# Patient Record
Sex: Female | Born: 1990 | Race: White | Hispanic: No | Marital: Single | Smoking: Current every day smoker
Health system: Southern US, Community
[De-identification: ages and names within clinical notes are randomized; demographics above are authoritative.]

## PROBLEM LIST (undated history)

## (undated) DIAGNOSIS — F32A Depression, unspecified: Secondary | ICD-10-CM

## (undated) DIAGNOSIS — F329 Major depressive disorder, single episode, unspecified: Secondary | ICD-10-CM

## (undated) HISTORY — PX: ANKLE SURGERY: SHX546

## (undated) HISTORY — PX: FRACTURE SURGERY: SHX138

---

## 2010-06-16 ENCOUNTER — Emergency Department (HOSPITAL_BASED_OUTPATIENT_CLINIC_OR_DEPARTMENT_OTHER)
Admission: EM | Admit: 2010-06-16 | Discharge: 2010-06-16 | Disposition: A | Discharge status: Home / Self Care | Payer: Self-pay | Attending: Emergency Medicine | Admitting: Emergency Medicine

## 2010-06-16 DIAGNOSIS — H9209 Otalgia, unspecified ear: Secondary | ICD-10-CM | POA: Insufficient documentation

## 2010-06-16 DIAGNOSIS — H6690 Otitis media, unspecified, unspecified ear: Secondary | ICD-10-CM | POA: Insufficient documentation

## 2010-06-16 DIAGNOSIS — F17200 Nicotine dependence, unspecified, uncomplicated: Secondary | ICD-10-CM | POA: Insufficient documentation

## 2012-08-17 ENCOUNTER — Encounter (HOSPITAL_BASED_OUTPATIENT_CLINIC_OR_DEPARTMENT_OTHER): Payer: Self-pay | Admitting: *Deleted

## 2012-08-17 ENCOUNTER — Emergency Department (HOSPITAL_BASED_OUTPATIENT_CLINIC_OR_DEPARTMENT_OTHER): Payer: Self-pay

## 2012-08-17 ENCOUNTER — Emergency Department (HOSPITAL_BASED_OUTPATIENT_CLINIC_OR_DEPARTMENT_OTHER)
Admission: EM | Admit: 2012-08-17 | Discharge: 2012-08-17 | Disposition: A | Discharge status: Home / Self Care | Payer: Self-pay | Attending: Emergency Medicine | Admitting: Emergency Medicine

## 2012-08-17 DIAGNOSIS — S92425A Nondisplaced fracture of distal phalanx of left great toe, initial encounter for closed fracture: Secondary | ICD-10-CM

## 2012-08-17 DIAGNOSIS — W2209XA Striking against other stationary object, initial encounter: Secondary | ICD-10-CM | POA: Insufficient documentation

## 2012-08-17 DIAGNOSIS — Y929 Unspecified place or not applicable: Secondary | ICD-10-CM | POA: Insufficient documentation

## 2012-08-17 DIAGNOSIS — Z9889 Other specified postprocedural states: Secondary | ICD-10-CM | POA: Insufficient documentation

## 2012-08-17 DIAGNOSIS — F17200 Nicotine dependence, unspecified, uncomplicated: Secondary | ICD-10-CM | POA: Insufficient documentation

## 2012-08-17 DIAGNOSIS — Y9389 Activity, other specified: Secondary | ICD-10-CM | POA: Insufficient documentation

## 2012-08-17 DIAGNOSIS — S92919A Unspecified fracture of unspecified toe(s), initial encounter for closed fracture: Secondary | ICD-10-CM | POA: Insufficient documentation

## 2012-08-17 MED ORDER — IBUPROFEN 800 MG PO TABS: 800.0000 mg | ORAL_TABLET | Freq: Three times a day (TID) | ORAL | Status: DC | PRN

## 2012-08-17 MED ORDER — HYDROCODONE-ACETAMINOPHEN 5-325 MG PO TABS: 1.0000 | ORAL_TABLET | Freq: Four times a day (QID) | ORAL | Status: AC | PRN

## 2012-08-17 NOTE — Discharge Instructions (Signed)
Return here as needed.  Followup with the orthopedic Dr. Gustavus Bryant and elevate the toe

## 2012-08-17 NOTE — ED Provider Notes (Signed)
History     CSN: [id number]  Arrival date & time 08/17/12  1843   First MD Initiated Contact with Patient 08/17/12 1851      Chief Complaint  Patient presents with  . Toe Injury    (Consider location/radiation/quality/duration/timing/severity/associated sxs/prior treatment) HPI Patient presents to the emergency department following a toe injury that occurred last night.  Patient, states, that she kicked a cooler with her left great toe by accident.  Patient, states she did not take any medications prior to arrival for her pain.  Patient, states, that movement and palpation make the pain, worse.  Patient denies numbness, or weakness of the toe History reviewed. No pertinent past medical history.  Past Surgical History  Procedure Laterality Date  . Fracture surgery      History reviewed. No pertinent family history.  History  Substance Use Topics  . Smoking status: Current Every Day Smoker  . Smokeless tobacco: Not on file  . Alcohol Use: Yes    OB History   Grav Para Term Preterm Abortions TAB SAB Ect Mult Living                  Review of Systems All other systems negative except as documented in the HPI. All pertinent positives and negatives as reviewed in the HPI. Allergies  Review of patient's allergies indicates no known allergies.  Home Medications  No current outpatient prescriptions on file.  BP 115/68  Pulse 98  Temp(Src) 98.4 F (36.9 C) (Oral)  Resp 18  Ht 5\' 6"  (1.676 m)  Wt 125 lb (56.7 kg)  BMI 20.19 kg/m2  SpO2 99%  LMP 08/10/2012  Physical Exam  Nursing note and vitals reviewed. Constitutional: She is oriented to person, place, and time. She appears well-developed and well-nourished.  HENT:  Head: Normocephalic and atraumatic.  Pulmonary/Chest: Effort normal.  Musculoskeletal:       Left foot: She exhibits decreased range of motion, tenderness and swelling. She exhibits normal capillary refill, no deformity and no laceration.   Feet:  Neurological: She is alert and oriented to person, place, and time.  Skin: Skin is warm and dry.    ED Course  Procedures (including critical care time)  Labs Reviewed - No data to display Dg Toe Great Left  08/17/2012   *RADIOLOGY REPORT*  Clinical Data: Toe pain secondary to blunt trauma.  LEFT GREAT TOE  Comparison: None.  Findings: There is a minimally displaced fracture of the lateral aspect of the base of the distal phalangeal bone.  Fracture does extend to the articular surface.  IMPRESSION: Fracture of the base of the distal phalangeal bone.   Original Report Authenticated By: Francene Boyers, M.D.   Patient retreated for fracture at the DIP joint.  Patient is advised to return here as needed or any worsening in her condition.  Patient will be buddy taped with a hard sole shoe    MDM          Carlyle Dolly, PA-C 08/17/12 1954

## 2012-08-17 NOTE — ED Notes (Signed)
Pt states she stumped her left great toe last p.m.

## 2012-08-17 NOTE — ED Provider Notes (Signed)
Medical screening examination/treatment/procedure(s) were performed by non-physician practitioner and as supervising physician I was immediately available for consultation/collaboration.   Charles B. Sheldon, MD 08/17/12 2334 

## 2013-06-16 ENCOUNTER — Other Ambulatory Visit: Payer: Self-pay

## 2013-12-17 ENCOUNTER — Other Ambulatory Visit (HOSPITAL_COMMUNITY): Payer: Self-pay | Admitting: Specialist

## 2013-12-18 ENCOUNTER — Encounter (HOSPITAL_COMMUNITY): Payer: Self-pay

## 2013-12-18 ENCOUNTER — Ambulatory Visit (HOSPITAL_COMMUNITY)
Admission: RE | Admit: 2013-12-18 | Discharge: 2013-12-18 | Disposition: A | Discharge status: Home / Self Care | Payer: Medicaid Other | Source: Ambulatory Visit | Attending: Specialist | Admitting: Specialist

## 2013-12-18 DIAGNOSIS — O99810 Abnormal glucose complicating pregnancy: Secondary | ICD-10-CM | POA: Insufficient documentation

## 2013-12-18 DIAGNOSIS — O24419 Gestational diabetes mellitus in pregnancy, unspecified control: Secondary | ICD-10-CM

## 2013-12-18 DIAGNOSIS — O365990 Maternal care for other known or suspected poor fetal growth, unspecified trimester, not applicable or unspecified: Secondary | ICD-10-CM | POA: Diagnosis not present

## 2013-12-18 DIAGNOSIS — Z0374 Encounter for suspected problem with fetal growth ruled out: Secondary | ICD-10-CM | POA: Diagnosis not present

## 2014-02-01 ENCOUNTER — Encounter (HOSPITAL_COMMUNITY): Payer: Self-pay

## 2014-05-10 ENCOUNTER — Emergency Department (HOSPITAL_BASED_OUTPATIENT_CLINIC_OR_DEPARTMENT_OTHER): Payer: Medicaid Other

## 2014-05-10 ENCOUNTER — Ambulatory Visit: Payer: Self-pay

## 2014-05-10 ENCOUNTER — Emergency Department (HOSPITAL_BASED_OUTPATIENT_CLINIC_OR_DEPARTMENT_OTHER)
Admission: EM | Admit: 2014-05-10 | Discharge: 2014-05-10 | Disposition: A | Discharge status: Home / Self Care | Payer: Medicaid Other | Attending: Emergency Medicine | Admitting: Emergency Medicine

## 2014-05-10 ENCOUNTER — Encounter (HOSPITAL_BASED_OUTPATIENT_CLINIC_OR_DEPARTMENT_OTHER): Payer: Self-pay | Admitting: Emergency Medicine

## 2014-05-10 DIAGNOSIS — S4991XA Unspecified injury of right shoulder and upper arm, initial encounter: Secondary | ICD-10-CM | POA: Insufficient documentation

## 2014-05-10 DIAGNOSIS — Y9389 Activity, other specified: Secondary | ICD-10-CM | POA: Insufficient documentation

## 2014-05-10 DIAGNOSIS — G8929 Other chronic pain: Secondary | ICD-10-CM | POA: Insufficient documentation

## 2014-05-10 DIAGNOSIS — R6884 Jaw pain: Secondary | ICD-10-CM | POA: Insufficient documentation

## 2014-05-10 DIAGNOSIS — M25511 Pain in right shoulder: Secondary | ICD-10-CM | POA: Diagnosis present

## 2014-05-10 DIAGNOSIS — Y9289 Other specified places as the place of occurrence of the external cause: Secondary | ICD-10-CM | POA: Diagnosis not present

## 2014-05-10 DIAGNOSIS — Z79899 Other long term (current) drug therapy: Secondary | ICD-10-CM | POA: Insufficient documentation

## 2014-05-10 DIAGNOSIS — X58XXXA Exposure to other specified factors, initial encounter: Secondary | ICD-10-CM | POA: Diagnosis not present

## 2014-05-10 DIAGNOSIS — Z791 Long term (current) use of non-steroidal anti-inflammatories (NSAID): Secondary | ICD-10-CM | POA: Diagnosis not present

## 2014-05-10 DIAGNOSIS — Z720 Tobacco use: Secondary | ICD-10-CM | POA: Diagnosis not present

## 2014-05-10 DIAGNOSIS — M25519 Pain in unspecified shoulder: Secondary | ICD-10-CM

## 2014-05-10 DIAGNOSIS — Y998 Other external cause status: Secondary | ICD-10-CM | POA: Insufficient documentation

## 2014-05-10 DIAGNOSIS — M549 Dorsalgia, unspecified: Secondary | ICD-10-CM | POA: Insufficient documentation

## 2014-05-10 DIAGNOSIS — Z794 Long term (current) use of insulin: Secondary | ICD-10-CM | POA: Insufficient documentation

## 2014-05-10 MED ORDER — IBUPROFEN 400 MG PO TABS
600.0000 mg | ORAL_TABLET | Freq: Once | ORAL | Status: AC
  Administered 2014-05-10: 600 mg via ORAL
  Filled 2014-05-10 (×2): qty 1

## 2014-05-10 MED ORDER — IBUPROFEN 600 MG PO TABS: 600.0000 mg | ORAL_TABLET | Freq: Three times a day (TID) | ORAL | Status: DC | PRN

## 2014-05-10 MED ORDER — HYDROCODONE-ACETAMINOPHEN 5-325 MG PO TABS: 1.0000 | ORAL_TABLET | Freq: Four times a day (QID) | ORAL | Status: AC | PRN

## 2014-05-10 NOTE — ED Notes (Signed)
MD at bedside to discuss results of testing. 

## 2014-05-10 NOTE — ED Notes (Signed)
Pt reports pain to right shoulder x 4 weeks, sharp shooting pains, also reports left sided jaw pain for years, unable to open completely

## 2014-05-10 NOTE — Discharge Instructions (Signed)

## 2014-05-10 NOTE — ED Provider Notes (Signed)
CSN: [id number]     Arrival date & time 05/10/14  1908 History   This chart was scribed for Elwin Mocha, MD by Abel Presto, ED Scribe. This patient was seen in room MH04/MH04 and the patient's care was started at 7:31 PM.    Chief Complaint  Patient presents with  . Shoulder Pain    Patient is a 24 y.o. female presenting with shoulder pain. The history is provided by the patient. No language interpreter was used.  Shoulder Pain Location:  Shoulder Time since incident:  4 weeks Shoulder location:  R shoulder Pain details:    Quality:  Shooting and sharp   Radiates to:  Does not radiate   Severity:  Moderate   Onset quality:  Sudden   Timing:  Sporadic   Progression:  Worsening Chronicity:  Recurrent Handedness:  Right-handed Dislocation: no   Foreign body present:  No foreign bodies Prior injury to area:  No Relieved by:  Being still Worsened by:  Movement Associated symptoms: back pain (chronic) and decreased range of motion   Associated symptoms: no numbness and no tingling     HPI Comments: Juliani Laduke is a 24 y.o. female who presents to the Emergency Department complaining of recurrent sharp shooting right shoulder pain with first onset 2 weeks ago worsening several days ago.  Pt states she was wiping a table at first onset and heard a popping noise. She notes pain resolved and returned.  Pt notes pain with movement.  She states she also has back pain but notes this is not new. Pt notes jaw pain. She states she is unable to open jaw completely. She states she gets associated headaches and notes yawns worsen the pain. Pt notes jaw pain started after removal of braces. Pt denies any injury before recent onset, numbness, and tingling.   History reviewed. No pertinent past medical history. Past Surgical History  Procedure Laterality Date  . Fracture surgery     History reviewed. No pertinent family history. History  Substance Use Topics  . Smoking status: Current Every  Day Smoker  . Smokeless tobacco: Not on file  . Alcohol Use: Yes   OB History    Gravida Para Term Preterm AB TAB SAB Ectopic Multiple Living       Review of Systems  HENT:       Jaw pain  Musculoskeletal: Positive for back pain (chronic) and arthralgias.  Neurological: Negative for numbness.  All other systems reviewed and are negative.     Allergies  Review of patient's allergies indicates no known allergies.  Home Medications   Prior to Admission medications   Medication Sig Start Date End Date Taking? Authorizing Provider  ibuprofen (ADVIL,MOTRIN) 800 MG tablet Take 1 tablet (800 mg total) by mouth every 8 (eight) hours as needed for pain. 08/17/12  Yes Jamesetta Orleans Lawyer, PA-C  HYDROcodone-acetaminophen (NORCO/VICODIN) 5-325 MG per tablet Take 1 tablet by mouth every 6 (six) hours as needed for pain. 08/17/12   Jamesetta Orleans Lawyer, PA-C  insulin NPH Human (HUMULIN N,NOVOLIN N) 100 UNIT/ML injection Inject 24 Units into the skin at bedtime.    Historical Provider, MD  Prenatal Vit w/Fe-Methylfol-FA (PNV PO) Take by mouth.    Historical Provider, MD   BP 138/74 mmHg  Pulse 86  Temp(Src) 98.7 F (37.1 C) (Oral)  Resp 18  Ht  (1.676 m)  Wt 133 lb (60.328 kg)  BMI 21.48 kg/m2  SpO2 100%  LMP 04/21/2014  Breastfeeding? Unknown Physical Exam  Constitutional: She is oriented to person, place, and time. She appears well-developed and well-nourished. No distress.  HENT:  Head: Normocephalic and atraumatic.  Eyes: EOM are normal.  Neck: Normal range of motion.  Cardiovascular: Normal rate, regular rhythm and normal heart sounds.   Pulmonary/Chest: Effort normal and breath sounds normal.  Abdominal: Soft. She exhibits no distension. There is no tenderness.  Musculoskeletal: Normal range of motion.  Neurological: She is alert and oriented to person, place, and time.  Skin: Skin is warm and dry.  Psychiatric: She has a normal mood and affect.  Judgment normal.  Nursing note and vitals reviewed.   ED Course  Procedures (including critical care time) DIAGNOSTIC STUDIES: Oxygen Saturation is 100% on room air, normal by my interpretation.    COORDINATION OF CARE: 7:38 PM Discussed treatment plan with patient at beside, the patient agrees with the plan and has no further questions at this time.   Labs Review Labs Reviewed - No data to display  Imaging Review Dg Shoulder Right  05/10/2014   CLINICAL DATA:  RIGHT shoulder pain for 4 weeks.  No injury.  EXAM: RIGHT SHOULDER - 2+ VIEW  COMPARISON:  None.  FINDINGS: There is no evidence of fracture or dislocation. There is no evidence of arthropathy or other focal bone abnormality. Soft tissues are unremarkable.  IMPRESSION: Negative.   Electronically Signed   By: Andreas NewportGeoffrey  Lamke M.D.   On: 05/10/2014 20:31     EKG Interpretation None      MDM   Final diagnoses:  Shoulder pain  Jaw pain   85F here with R shoulder pain. Heard a pop when wiping a table down a few days ago. Now having difficulty raising shoulder past about 45 degrees and using her R arm in general.  Also having pain with opening of her jaw on the L side, feels it pop routinely. No history of jaw dislocations.  On exam, pain with int/ext rotation of R shoulder. It is clinically rotated. No swelling. Will xray, I feel it is likely rotator cuff in origin. Xray negative. Given NSAIDs and small amount of pain medicine. L TMJ joint with some laxity. No malocclusion, no signs of dislocation. Will have her f/u with ENT.   I personally performed the services described in this documentation, which was scribed in my presence. The recorded information has been reviewed and is accurate.      Elwin MochaBlair Aldeen Riga, MD 05/10/14 2141

## 2015-03-15 IMAGING — CR DG TOE GREAT 2+V*L*
3 series · 3 of 3 positions shown · non-contrast
Comparison: None.

CLINICAL DATA: Toe pain secondary to blunt trauma.

LEFT GREAT TOE

[t toes ap left]
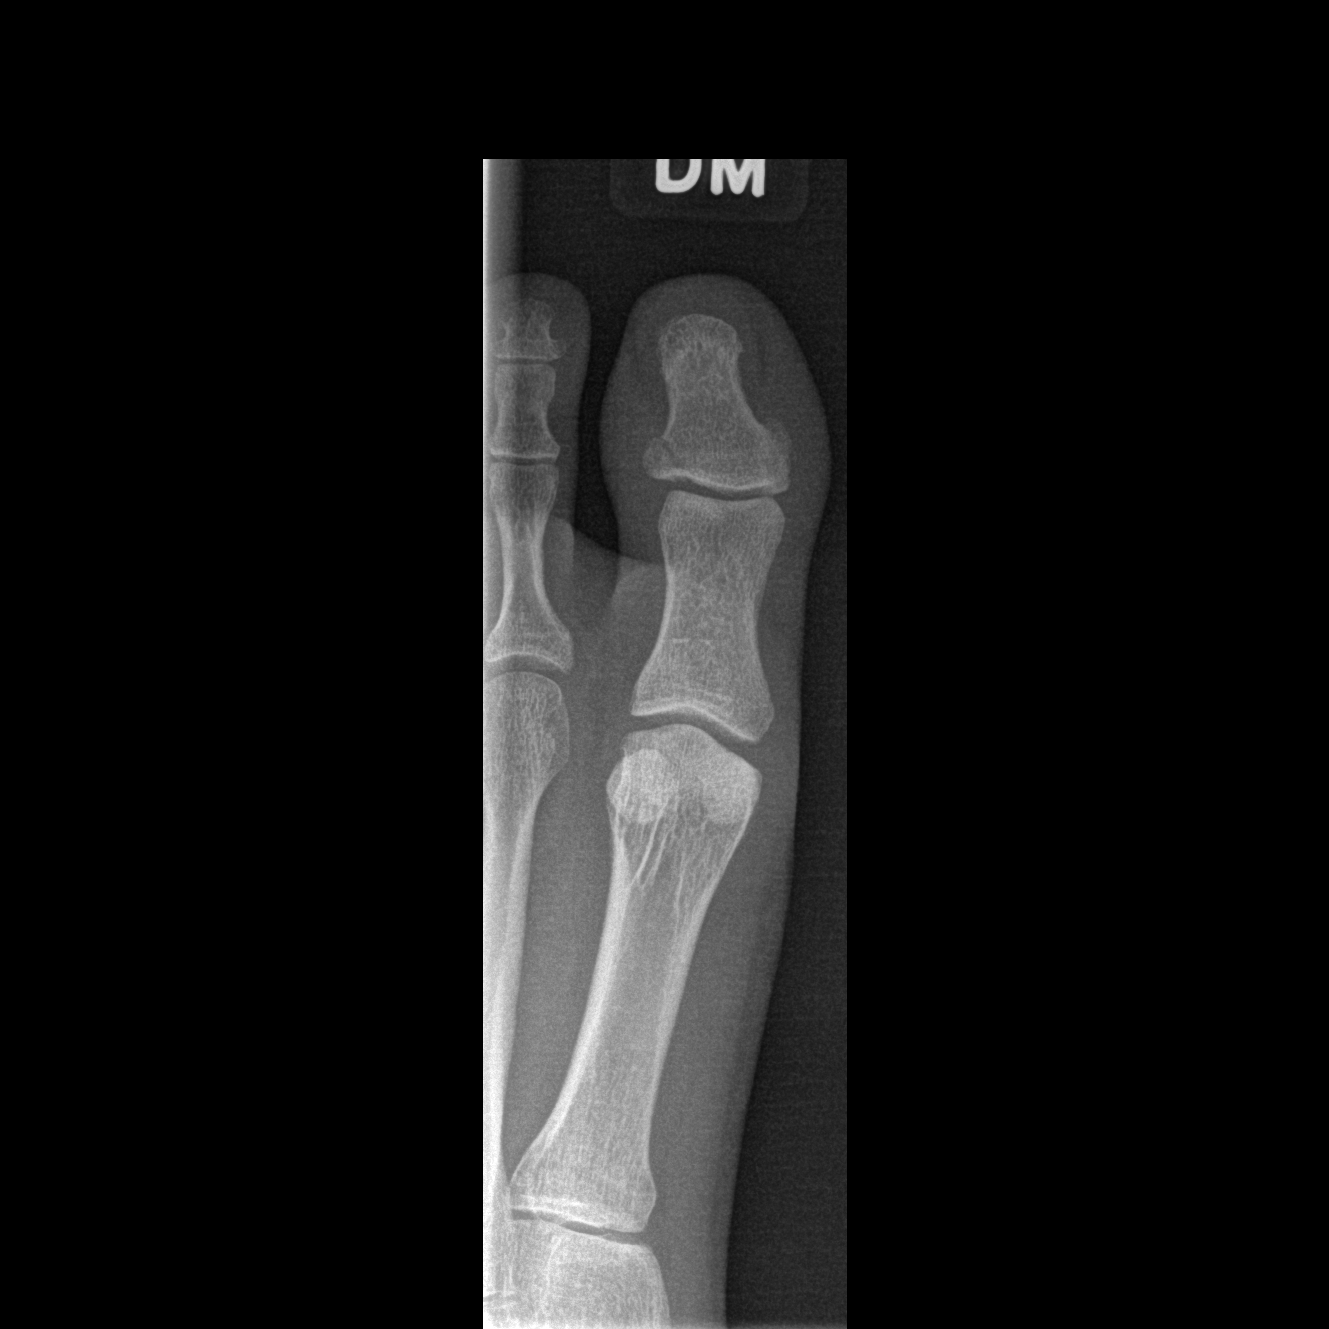

[t toes oblique left]
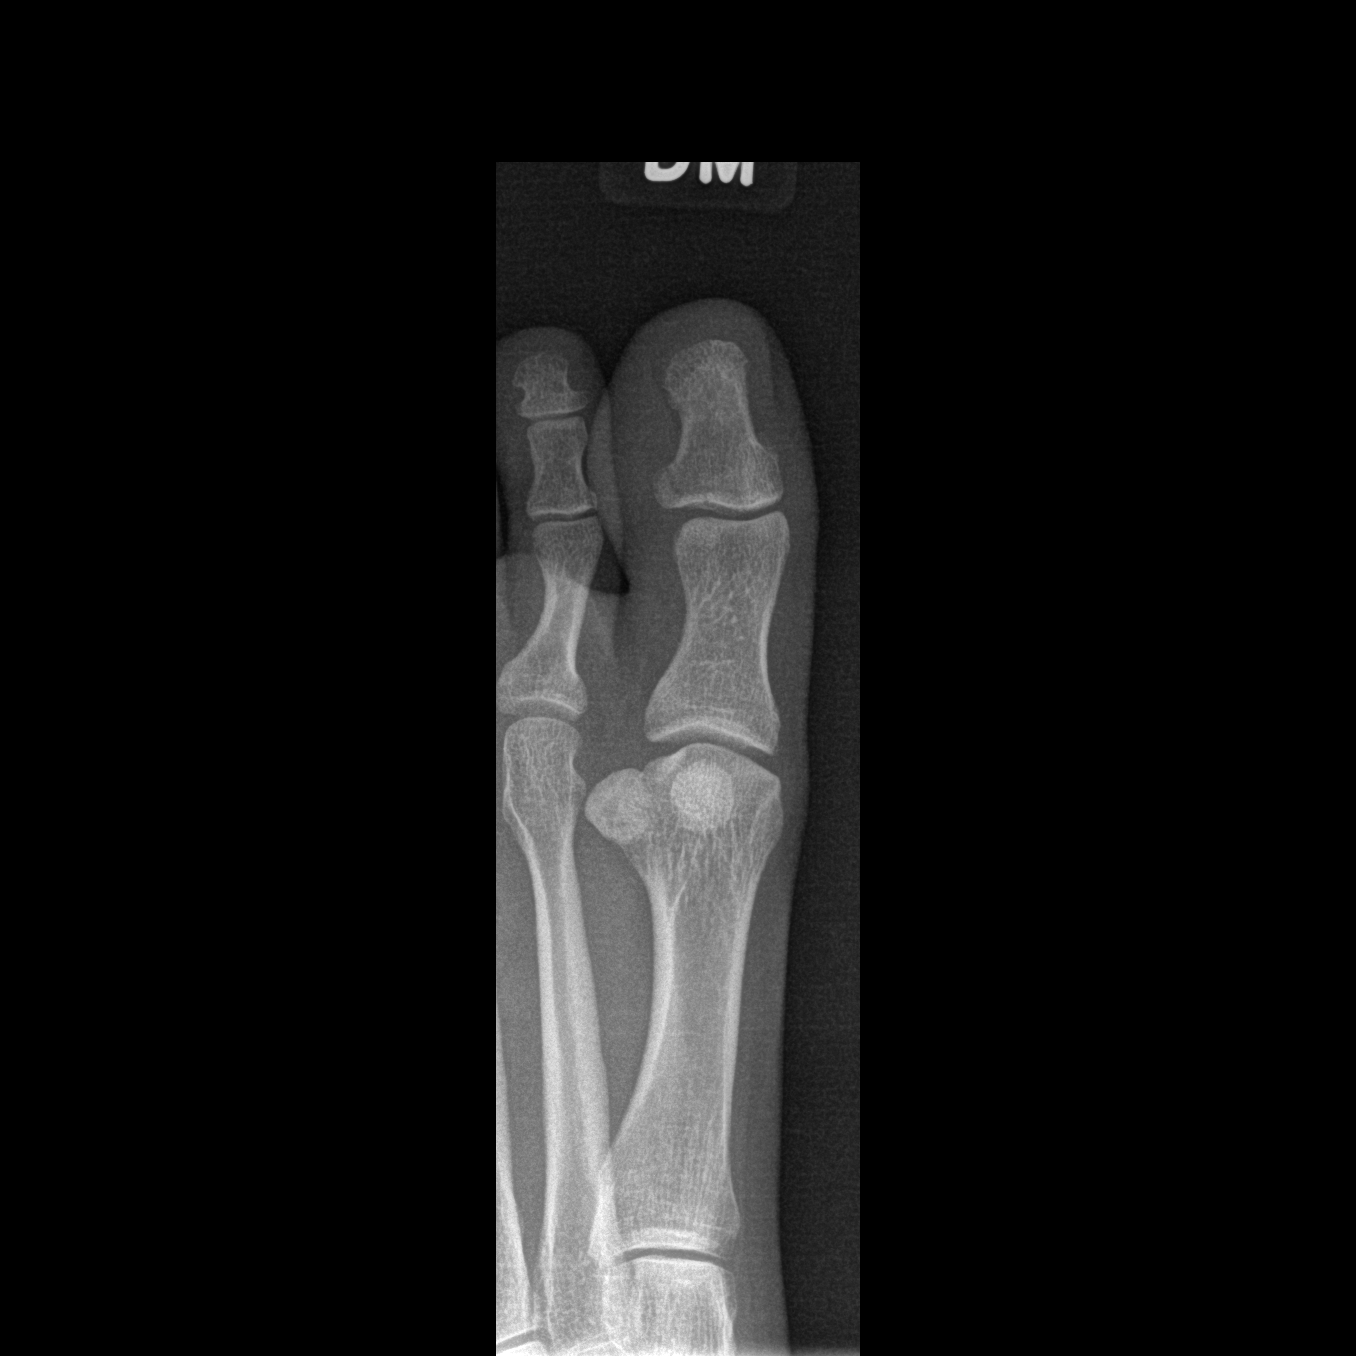

[t toes lateral left]
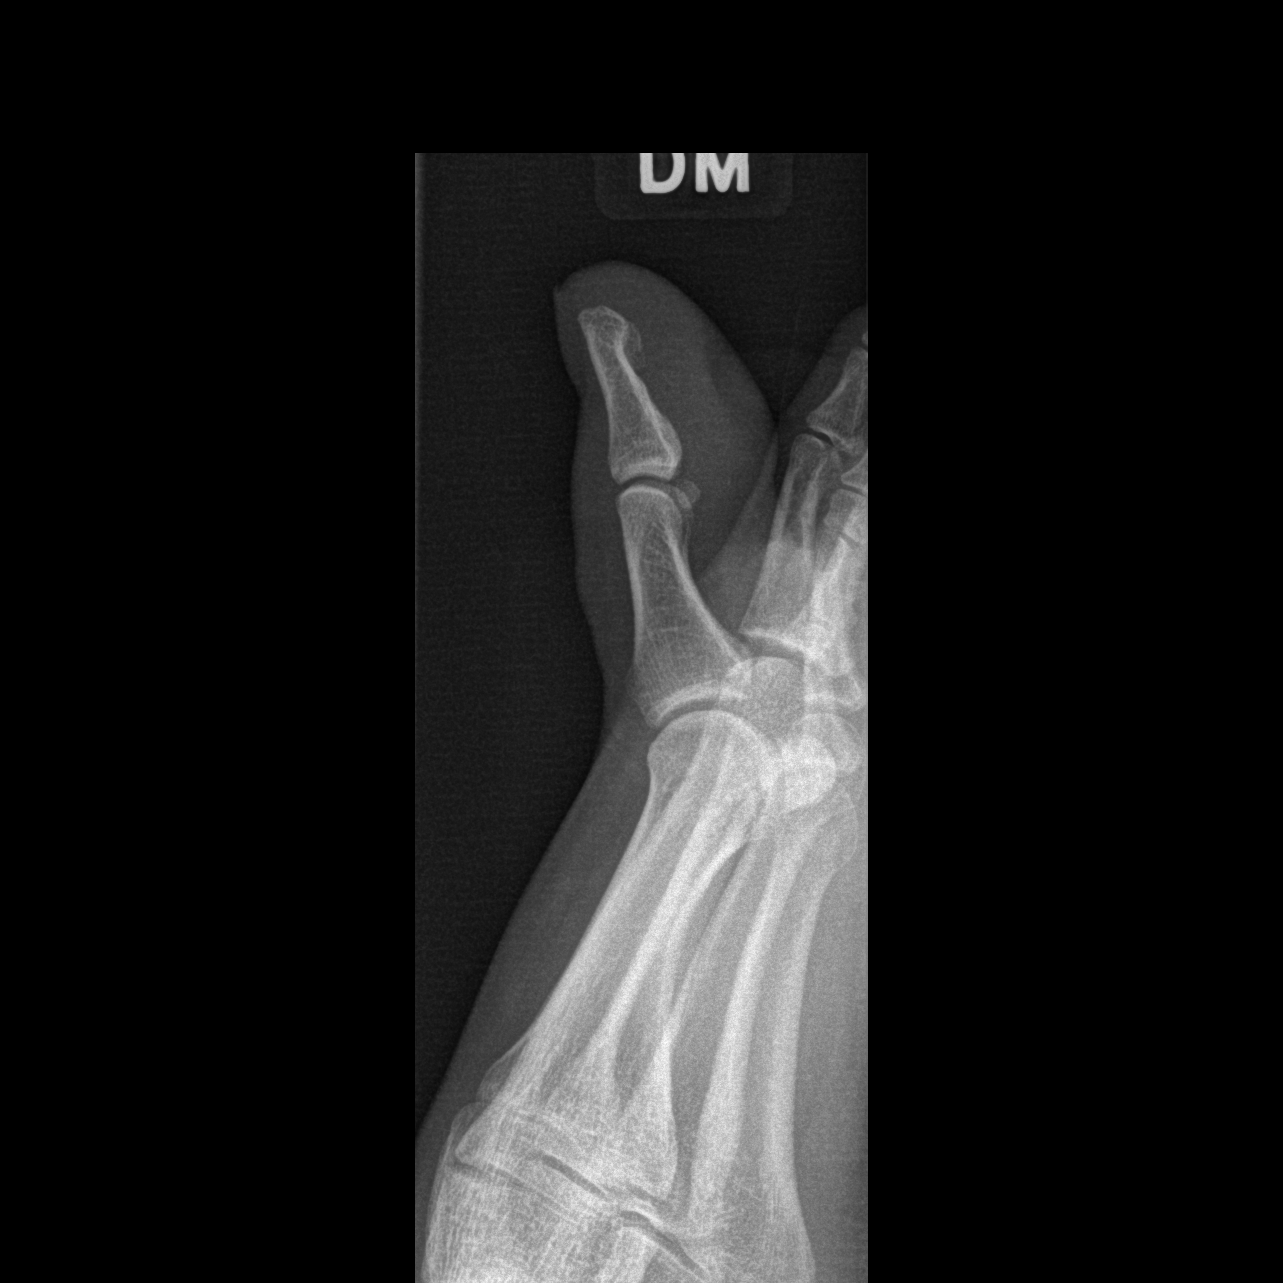

[3 of 3 positions shown; findings below may reference images not displayed]

FINDINGS: There is a minimally displaced fracture of the lateral
aspect of the base of the distal phalangeal bone.  Fracture does
extend to the articular surface.
IMPRESSION: Fracture of the base of the distal phalangeal bone.

## 2017-11-20 ENCOUNTER — Other Ambulatory Visit: Payer: Self-pay

## 2017-11-20 ENCOUNTER — Emergency Department (HOSPITAL_BASED_OUTPATIENT_CLINIC_OR_DEPARTMENT_OTHER)
Admission: EM | Admit: 2017-11-20 | Discharge: 2017-11-20 | Disposition: A | Discharge status: Home / Self Care | Payer: No Typology Code available for payment source | Attending: Emergency Medicine | Admitting: Emergency Medicine

## 2017-11-20 ENCOUNTER — Emergency Department (HOSPITAL_BASED_OUTPATIENT_CLINIC_OR_DEPARTMENT_OTHER): Payer: No Typology Code available for payment source

## 2017-11-20 ENCOUNTER — Encounter (HOSPITAL_BASED_OUTPATIENT_CLINIC_OR_DEPARTMENT_OTHER): Payer: Self-pay

## 2017-11-20 DIAGNOSIS — F17200 Nicotine dependence, unspecified, uncomplicated: Secondary | ICD-10-CM | POA: Insufficient documentation

## 2017-11-20 DIAGNOSIS — R51 Headache: Secondary | ICD-10-CM | POA: Diagnosis present

## 2017-11-20 DIAGNOSIS — M542 Cervicalgia: Secondary | ICD-10-CM | POA: Diagnosis not present

## 2017-11-20 HISTORY — DX: Depression, unspecified: F32.A

## 2017-11-20 HISTORY — DX: Major depressive disorder, single episode, unspecified: F32.9

## 2017-11-20 LAB — PREGNANCY, URINE: Preg Test, Ur: NEGATIVE

## 2017-11-20 MED ORDER — ACETAMINOPHEN 500 MG PO TABS
1000.0000 mg | ORAL_TABLET | Freq: Once | ORAL | Status: AC
Start: 2017-11-20 — End: 2017-11-20
  Administered 2017-11-20: 1000 mg via ORAL
  Filled 2017-11-20: qty 2
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
## 2017-11-22 ENCOUNTER — Other Ambulatory Visit: Payer: Self-pay

## 2017-11-22 ENCOUNTER — Emergency Department (HOSPITAL_BASED_OUTPATIENT_CLINIC_OR_DEPARTMENT_OTHER)
Admission: EM | Admit: 2017-11-22 | Discharge: 2017-11-22 | Disposition: A | Discharge status: Home / Self Care | Payer: No Typology Code available for payment source | Attending: Emergency Medicine | Admitting: Emergency Medicine

## 2017-11-22 ENCOUNTER — Encounter (HOSPITAL_BASED_OUTPATIENT_CLINIC_OR_DEPARTMENT_OTHER): Payer: Self-pay | Admitting: *Deleted

## 2017-11-22 DIAGNOSIS — M546 Pain in thoracic spine: Secondary | ICD-10-CM | POA: Diagnosis present

## 2017-11-22 DIAGNOSIS — Z794 Long term (current) use of insulin: Secondary | ICD-10-CM | POA: Insufficient documentation

## 2017-11-22 DIAGNOSIS — F17200 Nicotine dependence, unspecified, uncomplicated: Secondary | ICD-10-CM | POA: Insufficient documentation

## 2017-11-22 DIAGNOSIS — Z09 Encounter for follow-up examination after completed treatment for conditions other than malignant neoplasm: Secondary | ICD-10-CM

## 2017-11-22 DIAGNOSIS — R51 Headache: Secondary | ICD-10-CM | POA: Insufficient documentation

## 2017-11-22 DIAGNOSIS — R42 Dizziness and giddiness: Secondary | ICD-10-CM | POA: Insufficient documentation

## 2017-11-22 DIAGNOSIS — Z79899 Other long term (current) drug therapy: Secondary | ICD-10-CM | POA: Diagnosis not present

## 2017-11-22 MED ORDER — NAPROXEN 500 MG PO TABS: 500.0000 mg | ORAL_TABLET | Freq: Two times a day (BID) | ORAL | 0 refills | Status: AC

## 2017-11-22 MED ORDER — METHOCARBAMOL 500 MG PO TABS: 500.0000 mg | ORAL_TABLET | Freq: Two times a day (BID) | ORAL | 0 refills | Status: AC

## 2017-11-22 MED ORDER — LIDOCAINE 5 % EX PTCH: 1.0000 | MEDICATED_PATCH | CUTANEOUS | 0 refills | Status: AC

## 2017-11-22 NOTE — Discharge Instructions (Addendum)
°  Antiinflammatory medications: Take 600 mg of ibuprofen every 6 hours or 440 mg (over the counter dose) to 500 mg (prescription dose) of naproxen every 12 hours for the next 3 days. After this time, these medications may be used as needed for pain. Take these medications with food to avoid upset stomach. Choose only one of these medications, do not take them together. Acetaminophen (generic for Tylenol): Should you continue to have additional pain while taking the ibuprofen or naproxen, you may add in acetaminophen as needed. Your daily total maximum amount of acetaminophen from all sources should be limited to $RemoveBefor2000mg /day for those with liver problems. Muscle relaxer: Robaxin is a muscle relaxer and may help loosen stiff muscles. Do not take the Robaxin while driving or performing other dangerous activities.  Lidocaine patches: These are available via either prescription or over-the-counter. The over-the-counter option may be more economical one and are likely just as effective. There are multiple over-the-counter brands, such as Salonpas. Exercises: Be sure to perform the attached exercises starting with three times a week and working up to performing them daily. This is an essential part of preventing long term problems.  Follow up: Follow up with a primary care provider for any future management of these complaints. Be sure to follow up within 7-10 days. Return: Return to the ED should symptoms worsen.   Overall head injury/concussion care: Rest: Be sure to get plenty of rest. You will need more rest and sleep while you recover. Hydration: Be sure to stay well hydrated by having a goal of drinking about 0.5 liters of water an hour. Return to sports and activities: In general, you may return to normal activities once symptoms have subsided, however, you would ideally be cleared by a primary care provider or other qualified medical professional prior to  return to these activities.  Follow up: Follow up with the concussion clinic or your primary care provider for further management of this issue. Return: Return to the ED should you begin to have confusion, abnormal behavior, aggression, violence, or personality changes, repeated vomiting, vision loss, numbness or weakness on one side of the body, difficulty standing due to dizziness, significantly worsening pain, or any other major concerns.
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
## 2018-01-15 ENCOUNTER — Other Ambulatory Visit: Payer: Self-pay

## 2018-01-15 ENCOUNTER — Emergency Department (HOSPITAL_BASED_OUTPATIENT_CLINIC_OR_DEPARTMENT_OTHER)
Admission: EM | Admit: 2018-01-15 | Discharge: 2018-01-15 | Disposition: A | Discharge status: Home / Self Care | Payer: Self-pay | Attending: Emergency Medicine | Admitting: Emergency Medicine

## 2018-01-15 ENCOUNTER — Encounter (HOSPITAL_BASED_OUTPATIENT_CLINIC_OR_DEPARTMENT_OTHER): Payer: Self-pay | Admitting: *Deleted

## 2018-01-15 DIAGNOSIS — Z3201 Encounter for pregnancy test, result positive: Secondary | ICD-10-CM | POA: Insufficient documentation

## 2018-01-15 DIAGNOSIS — O26891 Other specified pregnancy related conditions, first trimester: Secondary | ICD-10-CM | POA: Insufficient documentation

## 2018-01-15 DIAGNOSIS — F17200 Nicotine dependence, unspecified, uncomplicated: Secondary | ICD-10-CM | POA: Insufficient documentation

## 2018-01-15 DIAGNOSIS — R1030 Lower abdominal pain, unspecified: Secondary | ICD-10-CM | POA: Insufficient documentation

## 2018-01-15 LAB — URINALYSIS, MICROSCOPIC (REFLEX)

## 2018-01-15 LAB — URINALYSIS, ROUTINE W REFLEX MICROSCOPIC
BILIRUBIN URINE: NEGATIVE
GLUCOSE, UA: NEGATIVE mg/dL
Hgb urine dipstick: NEGATIVE
KETONES UR: NEGATIVE mg/dL
Nitrite: NEGATIVE
PH: 7.5 (ref 5.0–8.0)
PROTEIN: NEGATIVE mg/dL
Specific Gravity, Urine: 1.015 (ref 1.005–1.030)

## 2018-01-15 LAB — HCG, QUANTITATIVE, PREGNANCY: hCG, Beta Chain, Quant, S: 7 m[IU]/mL — ABNORMAL HIGH (ref <5)

## 2018-01-15 LAB — PREGNANCY, URINE: PREG TEST UR: NEGATIVE

## 2018-01-15 NOTE — ED Notes (Signed)
NAD at this time. Pt is stable and going home.  

## 2018-01-15 NOTE — ED Triage Notes (Signed)
Pt reports one week of constipation, abd "fullness" had 3 + HPT last week, this am had increased cramping with some light spotting when she wiped, took another HPT and it read negative. Pt states she went to work, and her coworkers told her to come here and get looked at.

## 2018-01-15 NOTE — ED Provider Notes (Signed)
[facility] EMERGENCY DEPARTMENT Provider Note   CSN: [id number] Arrival date & time: 01/15/18  1027     History   Chief Complaint Chief Complaint  Patient presents with  . Abdominal Cramping    HPI Monique Gonzales is a 27 y.o. female.  HPI Patient reports she is taken for positive home pregnancy test that were positive and then she took another one today and it was negative.  She reports he has had all the symptoms and feels like she is pregnant.  She has had nausea in the mornings.  She is had crampy lower abdominal pain.  She reports she only spotted for a couple of days 2 weeks ago.  Ports she has a feeling like she is constipated,  Past Medical History:  Diagnosis Date  . Depression     There are no active problems to display for this patient.   Past Surgical History:  Procedure Laterality Date  . ANKLE SURGERY    . FRACTURE SURGERY       OB History    Gravida  2   Para  0   Term  0   Preterm  0   AB  1   Living  0     SAB  0   TAB  1   Ectopic  0   Multiple  0   Live Births               Home Medications    Prior to Admission medications   Medication Sig Start Date End Date Taking? Authorizing Provider  HYDROcodone-acetaminophen (NORCO/VICODIN) 5-325 MG per tablet Take 1 tablet by mouth every 6 (six) hours as needed for pain. 08/17/12   Lawyer, Cristal Deer, PA-C  HYDROcodone-acetaminophen (NORCO/VICODIN) 5-325 MG per tablet Take 1 tablet by mouth every 6 (six) hours as needed for moderate pain. 05/10/14   Elwin Mocha, MD  ibuprofen (ADVIL,MOTRIN) 600 MG tablet Take 1 tablet (600 mg total) by mouth every 8 (eight) hours as needed. 05/10/14   Elwin Mocha, MD  ibuprofen (ADVIL,MOTRIN) 800 MG tablet Take 1 tablet (800 mg total) by mouth every 8 (eight) hours as needed for pain. 08/17/12   Lawyer, Cristal Deer, PA-C  insulin NPH Human (HUMULIN N,NOVOLIN N) 100 UNIT/ML injection Inject 24 Units into the skin at bedtime.    [provider]  lidocaine (LIDODERM) 5 % Place 1 patch onto the skin daily. Remove & Discard patch within 12 hours or as directed by MD 11/22/17   Joy, Shawn C, PA-C  methocarbamol (ROBAXIN) 500 MG tablet Take 1 tablet (500 mg total) by mouth 2 (two) times daily. 11/22/17   Joy, Shawn C, PA-C  naproxen (NAPROSYN) 500 MG tablet Take 1 tablet (500 mg total) by mouth 2 (two) times daily. 11/22/17   Joy, Shawn C, PA-C  Prenatal Vit w/Fe-Methylfol-FA (PNV PO) Take by mouth.    [provider]    Family History History reviewed. No pertinent family history.  Social History Social History   Tobacco Use  . Smoking status: Current Every Day Smoker  . Smokeless tobacco: Never Used  Substance Use Topics  . Alcohol use: Yes    Comment: occ  . Drug use: No     Allergies   Penicillins and Sulfa antibiotics   Review of Systems Review of Systems 10 Systems reviewed and are negative for acute change except as noted in the HPI.  Physical Exam Updated Vital Signs BP 112/72 (BP Location: Right Arm)  Pulse 74   Temp 98.3 F (36.8 C) (Oral)   Resp 18   Ht 5\' 6"  (1.676 m)   Wt 65.8 kg   LMP 01/01/2018   SpO2 100%   BMI 23.40 kg/m   Physical Exam  Constitutional: She is oriented to person, place, and time.  Patient is clinically well in appearance.  She is nontoxic and alert.  No respiratory distress.  HENT:  Head: Normocephalic and atraumatic.  Eyes: EOM are normal.  Cardiovascular: Normal rate, regular rhythm, normal heart sounds and intact distal pulses.  Pulmonary/Chest: Effort normal and breath sounds normal.  Abdominal: Soft. She exhibits no distension.  Mild suprapubic discomfort to palpation.  Neurological: She is alert and oriented to person, place, and time. She exhibits normal muscle tone. Coordination normal.  Skin: Skin is warm and dry.  Psychiatric: She has a normal mood and affect.     ED Treatments / Results  Labs (all labs ordered are listed, but only  abnormal results are displayed) Labs Reviewed  URINALYSIS, ROUTINE W REFLEX MICROSCOPIC - Abnormal; Notable for the following components:      Result Value   Leukocytes, UA MODERATE (*)    All other components within normal limits  HCG, QUANTITATIVE, PREGNANCY - Abnormal; Notable for the following components:   hCG, Beta Chain, Quant, S 7 (*)    All other components within normal limits  URINALYSIS, MICROSCOPIC (REFLEX) - Abnormal; Notable for the following components:   Bacteria, UA FEW (*)    All other components within normal limits  PREGNANCY, URINE    EKG None  Radiology No results found.  Procedures Procedures (including critical care time)  Medications Ordered in ED Medications - No data to display   Initial Impression / Assessment and Plan / ED Course  I have reviewed the triage vital signs and the nursing notes.  Pertinent labs & imaging results that were available during my care of the patient were reviewed by me and considered in my medical decision making (see chart for details).    Patient had a equivocal readings on home pregnancy test and physically felt as though she was pregnant.  Her urine pregnancy test was negative here.  I did do a serum test which only reads trace positive at 7.  Possibly, patient had early pregnancy with spontaneous miscarriage when she had the "spotting" 2 weeks ago and the hCG has been trending down and is now unreadable by urine test and only faintly readable and serum testing.  Patient is clinically well.  She has a soft and nontender abdomen, stable for outpatient follow-up with GYN.  Counseled on necessity of repeat quant in 2 days.  Final Clinical Impressions(s) / ED Diagnoses   Final diagnoses:  Positive blood pregnancy test    ED Discharge Orders    None       Arby Barrette, MD 01/25/18 [phone]

## 2018-01-15 NOTE — Discharge Instructions (Addendum)
1.  Your blood pregnancy test is only very mildly positive.  You will have to get a repeat blood pregnancy test in 2 days.  If it is elevating, you may be pregnant. 2.  You need to be seen by your gynecologist for recheck within the next week.

## 2018-04-03 ENCOUNTER — Other Ambulatory Visit: Payer: Self-pay

## 2018-04-03 ENCOUNTER — Emergency Department (HOSPITAL_BASED_OUTPATIENT_CLINIC_OR_DEPARTMENT_OTHER)
Admission: EM | Admit: 2018-04-03 | Discharge: 2018-04-03 | Disposition: A | Discharge status: Home / Self Care | Payer: BLUE CROSS/BLUE SHIELD | Attending: Emergency Medicine | Admitting: Emergency Medicine

## 2018-04-03 ENCOUNTER — Encounter (HOSPITAL_BASED_OUTPATIENT_CLINIC_OR_DEPARTMENT_OTHER): Payer: Self-pay | Admitting: *Deleted

## 2018-04-03 DIAGNOSIS — J111 Influenza due to unidentified influenza virus with other respiratory manifestations: Secondary | ICD-10-CM | POA: Diagnosis not present

## 2018-04-03 DIAGNOSIS — R0981 Nasal congestion: Secondary | ICD-10-CM | POA: Diagnosis not present

## 2018-04-03 DIAGNOSIS — F17200 Nicotine dependence, unspecified, uncomplicated: Secondary | ICD-10-CM | POA: Diagnosis not present

## 2018-04-03 DIAGNOSIS — R05 Cough: Secondary | ICD-10-CM | POA: Diagnosis present

## 2018-04-03 DIAGNOSIS — Z79899 Other long term (current) drug therapy: Secondary | ICD-10-CM | POA: Diagnosis not present

## 2018-04-03 MED ORDER — KETOROLAC TROMETHAMINE 60 MG/2ML IM SOLN
60.0000 mg | Freq: Once | INTRAMUSCULAR | Status: AC
  Administered 2018-04-03: 60 mg via INTRAMUSCULAR
  Filled 2018-04-03: qty 2

## 2018-04-03 MED ORDER — GUAIFENESIN ER 1200 MG PO TB12: 1.0000 | ORAL_TABLET | Freq: Two times a day (BID) | ORAL | 0 refills | Status: AC

## 2018-04-03 MED ORDER — IBUPROFEN 800 MG PO TABS: 800.0000 mg | ORAL_TABLET | Freq: Three times a day (TID) | ORAL | 0 refills | Status: AC | PRN

## 2018-04-03 MED ORDER — ACETAMINOPHEN-CODEINE 120-12 MG/5ML PO SOLN: 10.0000 mL | ORAL | 0 refills | Status: AC | PRN

## 2018-04-03 NOTE — ED Provider Notes (Signed)
[facility] EMERGENCY DEPARTMENT Provider Note   CSN: [id number] Arrival date & time: 04/03/18  1602     History   Chief Complaint Chief Complaint  Patient presents with  . URI    HPI Monique Gonzales is a 28 y.o. female.  HPI Patient presents to the emergency department with cough, nasal congestion, earache, headache, sore throat and body aches.  The patient states she was recent diagnosed with the flu but feels like her symptoms are worse.  The patient states that nothing seems make the condition better or worse.  She states that she has not taken any medications for her symptoms.  The patient denies chest pain, shortness of breath, headache,blurred vision, neck pain, fever,weakness, numbness, dizziness, anorexia, edema, abdominal pain, nausea, vomiting, diarrhea, rash, back pain, dysuria, hematemesis, bloody stool, near syncope, or syncope. Past Medical History:  Diagnosis Date  . Depression     There are no active problems to display for this patient.   Past Surgical History:  Procedure Laterality Date  . ANKLE SURGERY    . FRACTURE SURGERY       OB History    Gravida  2   Para  0   Term  0   Preterm  0   AB  1   Living  0     SAB  0   TAB  1   Ectopic  0   Multiple  0   Live Births               Home Medications    Prior to Admission medications   Medication Sig Start Date End Date Taking? Authorizing Provider  amphetamine-dextroamphetamine (ADDERALL) 15 MG tablet Take by mouth. 03/20/18 04/19/18 Yes [provider]  DULoxetine (CYMBALTA) 60 MG capsule Take by mouth. 07/03/17  Yes [provider]  HYDROcodone-acetaminophen (NORCO/VICODIN) 5-325 MG per tablet Take 1 tablet by mouth every 6 (six) hours as needed for pain. 08/17/12   Gena Laski, Cristal Deer, PA-C  HYDROcodone-acetaminophen (NORCO/VICODIN) 5-325 MG per tablet Take 1 tablet by mouth every 6 (six) hours as needed for moderate pain. 05/10/14   Elwin Mocha, MD    ibuprofen (ADVIL,MOTRIN) 600 MG tablet Take 1 tablet (600 mg total) by mouth every 8 (eight) hours as needed. 05/10/14   Elwin Mocha, MD  ibuprofen (ADVIL,MOTRIN) 800 MG tablet Take 1 tablet (800 mg total) by mouth every 8 (eight) hours as needed for pain. 08/17/12   Kyoko Elsea, Cristal Deer, PA-C  insulin NPH Human (HUMULIN N,NOVOLIN N) 100 UNIT/ML injection Inject 24 Units into the skin at bedtime.    [provider]  lidocaine (LIDODERM) 5 % Place 1 patch onto the skin daily. Remove & Discard patch within 12 hours or as directed by MD 11/22/17   Joy, Shawn C, PA-C  methocarbamol (ROBAXIN) 500 MG tablet Take 1 tablet (500 mg total) by mouth 2 (two) times daily. 11/22/17   Joy, Shawn C, PA-C  naproxen (NAPROSYN) 500 MG tablet Take 1 tablet (500 mg total) by mouth 2 (two) times daily. 11/22/17   Joy, Shawn C, PA-C  Prenatal Vit w/Fe-Methylfol-FA (PNV PO) Take by mouth.    [provider]    Family History No family history on file.  Social History Social History   Tobacco Use  . Smoking status: Current Every Day Smoker  . Smokeless tobacco: Never Used  Substance Use Topics  . Alcohol use: Yes    Comment: occ  . Drug use: No  Allergies   Penicillins and Sulfa antibiotics   Review of Systems Review of Systems All other systems negative except as documented in the HPI. All pertinent positives and negatives as reviewed in the HPI.  Physical Exam Updated Vital Signs BP 122/83 (BP Location: Right Arm)   Pulse 74   Temp 98.3 F (36.8 C) (Oral)   Resp 18   Ht 5\' 7"  (1.702 m)   Wt 70.3 kg   LMP 04/03/2018   SpO2 96%   BMI 24.28 kg/m   Physical Exam Vitals signs and nursing note reviewed.  Constitutional:      General: She is not in acute distress.    Appearance: She is well-developed.  HENT:     Head: Normocephalic and atraumatic.  Eyes:     Pupils: Pupils are equal, round, and reactive to light.  Neck:     Musculoskeletal: Normal range of motion and neck  supple.  Cardiovascular:     Rate and Rhythm: Normal rate and regular rhythm.     Heart sounds: Normal heart sounds. No murmur. No friction rub. No gallop.   Pulmonary:     Effort: Pulmonary effort is normal. No respiratory distress.     Breath sounds: Normal breath sounds. No wheezing.  Abdominal:     General: Bowel sounds are normal. There is no distension.     Palpations: Abdomen is soft.     Tenderness: There is no abdominal tenderness.  Skin:    General: Skin is warm and dry.     Capillary Refill: Capillary refill takes less than 2 seconds.     Findings: No erythema or rash.  Neurological:     Mental Status: She is alert and oriented to person, place, and time.     Motor: No abnormal muscle tone.     Coordination: Coordination normal.  Psychiatric:        Behavior: Behavior normal.      ED Treatments / Results  Labs (all labs ordered are listed, but only abnormal results are displayed) Labs Reviewed - No data to display  EKG None  Radiology No results found.  Procedures Procedures (including critical care time)  Medications Ordered in ED Medications  ketorolac (TORADOL) injection 60 mg (60 mg Intramuscular Given 04/03/18 2009)     Initial Impression / Assessment and Plan / ED Course  I have reviewed the triage vital signs and the nursing notes.  Pertinent labs & imaging results that were available during my care of the patient were reviewed by me and considered in my medical decision making (see chart for details).     Patient be treated for an influenza.  The patient is advised to return here as needed.  Advised her to increase her fluid intake and rest as much as possible.  Patient agrees the plan and all questions were answered.  Final Clinical Impressions(s) / ED Diagnoses   Final diagnoses:  None    ED Discharge Orders    None       Kyra Manges 04/03/18 2014    Loren Racer, MD 04/05/18 [phone]

## 2018-04-03 NOTE — ED Notes (Addendum)
Pt states she has been coughing with general body aches x 2 weeks. She also is c/o ear pain, feeling like her ears are muffled. Pt states she has pain with inspiration

## 2018-04-03 NOTE — Discharge Instructions (Addendum)
Return here as needed.  Increase your fluid intake.  Rest as much as possible. °

## 2018-04-03 NOTE — ED Triage Notes (Signed)
C/o URi symptoms x  1 week  , family at home with same
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
